# Patient Record
Sex: Male | Born: 1985 | Hispanic: Yes | Marital: Single | Smoking: Never smoker
Health system: Southern US, Community
[De-identification: ages and names within clinical notes are randomized; demographics above are authoritative.]

## PROBLEM LIST (undated history)

## (undated) DIAGNOSIS — S43006A Unspecified dislocation of unspecified shoulder joint, initial encounter: Secondary | ICD-10-CM

---

## 2012-02-12 ENCOUNTER — Emergency Department: Payer: Self-pay | Admitting: Emergency Medicine

## 2012-02-12 LAB — URINALYSIS, COMPLETE
Glucose,UR: NEGATIVE mg/dL (ref 0–75)
Leukocyte Esterase: NEGATIVE
Protein: 30
RBC,UR: 29 /HPF (ref 0–5)
WBC UR: <1 /HPF (ref 0–5)

## 2012-02-12 LAB — COMPREHENSIVE METABOLIC PANEL
Anion Gap: 8 (ref 7–16)
BUN: 19 mg/dL — ABNORMAL HIGH (ref 7–18)
Bilirubin,Total: 1.5 mg/dL — ABNORMAL HIGH (ref 0.2–1.0)
Chloride: 104 mmol/L (ref 98–107)
Co2: 27 mmol/L (ref 21–32)
Creatinine: 0.86 mg/dL (ref 0.60–1.30)
Glucose: 110 mg/dL — ABNORMAL HIGH (ref 65–99)
Potassium: 3.7 mmol/L (ref 3.5–5.1)
SGOT(AST): 12 U/L — ABNORMAL LOW (ref 15–37)
Sodium: 139 mmol/L (ref 136–145)

## 2012-02-12 LAB — CBC
HCT: 45.9 % (ref 40.0–52.0)
MCHC: 34.2 g/dL (ref 32.0–36.0)
MCV: 85 fL (ref 80–100)
Platelet: 205 10*3/uL (ref 150–440)
RBC: 5.4 10*6/uL (ref 4.40–5.90)
WBC: 7.9 10*3/uL (ref 3.8–10.6)

## 2020-03-23 ENCOUNTER — Other Ambulatory Visit: Payer: Self-pay | Admitting: Orthopedic Surgery

## 2020-03-23 DIAGNOSIS — S42142A Displaced fracture of glenoid cavity of scapula, left shoulder, initial encounter for closed fracture: Secondary | ICD-10-CM

## 2020-03-26 ENCOUNTER — Ambulatory Visit: Payer: Self-pay

## 2020-03-27 ENCOUNTER — Ambulatory Visit
Admission: RE | Admit: 2020-03-27 | Discharge: 2020-03-27 | Disposition: A | Discharge status: Home / Self Care | Payer: BLUE CROSS/BLUE SHIELD | Source: Ambulatory Visit | Attending: Orthopedic Surgery | Admitting: Orthopedic Surgery

## 2020-03-27 ENCOUNTER — Other Ambulatory Visit: Payer: Self-pay | Admitting: Orthopedic Surgery

## 2020-03-27 ENCOUNTER — Other Ambulatory Visit: Payer: Self-pay

## 2020-03-27 DIAGNOSIS — S42142A Displaced fracture of glenoid cavity of scapula, left shoulder, initial encounter for closed fracture: Secondary | ICD-10-CM | POA: Diagnosis present

## 2020-03-27 DIAGNOSIS — S42152A Displaced fracture of neck of scapula, left shoulder, initial encounter for closed fracture: Secondary | ICD-10-CM | POA: Insufficient documentation

## 2020-04-10 ENCOUNTER — Ambulatory Visit: Payer: Self-pay

## 2020-09-29 ENCOUNTER — Other Ambulatory Visit: Payer: Self-pay

## 2020-09-29 ENCOUNTER — Emergency Department: Payer: BLUE CROSS/BLUE SHIELD

## 2020-09-29 DIAGNOSIS — S4992XA Unspecified injury of left shoulder and upper arm, initial encounter: Secondary | ICD-10-CM | POA: Diagnosis present

## 2020-09-29 DIAGNOSIS — S43005A Unspecified dislocation of left shoulder joint, initial encounter: Secondary | ICD-10-CM | POA: Diagnosis not present

## 2020-09-29 DIAGNOSIS — Y990 Civilian activity done for income or pay: Secondary | ICD-10-CM | POA: Diagnosis not present

## 2020-09-29 DIAGNOSIS — W010XXA Fall on same level from slipping, tripping and stumbling without subsequent striking against object, initial encounter: Secondary | ICD-10-CM | POA: Diagnosis not present

## 2020-09-29 DIAGNOSIS — R52 Pain, unspecified: Secondary | ICD-10-CM

## 2020-09-29 NOTE — ED Triage Notes (Signed)
Patient with deformity to left shoulder after fall. Patient with previous hx of dislocation to same shoulder.

## 2020-09-30 ENCOUNTER — Encounter: Payer: Self-pay | Admitting: Radiology

## 2020-09-30 ENCOUNTER — Emergency Department
Admission: EM | Admit: 2020-09-30 | Discharge: 2020-09-30 | Disposition: A | Discharge status: Home / Self Care | Payer: BLUE CROSS/BLUE SHIELD | Attending: Emergency Medicine | Admitting: Emergency Medicine

## 2020-09-30 ENCOUNTER — Emergency Department: Payer: BLUE CROSS/BLUE SHIELD

## 2020-09-30 DIAGNOSIS — R52 Pain, unspecified: Secondary | ICD-10-CM

## 2020-09-30 DIAGNOSIS — S43005A Unspecified dislocation of left shoulder joint, initial encounter: Secondary | ICD-10-CM

## 2020-09-30 HISTORY — DX: Unspecified dislocation of unspecified shoulder joint, initial encounter: S43.006A

## 2020-09-30 MED ORDER — LIDOCAINE HCL (PF) 1 % IJ SOLN
10.0000 mL | Freq: Once | INTRAMUSCULAR | Status: AC
  Administered 2020-09-30: 10 mL via INTRADERMAL
  Filled 2020-09-30: qty 10

## 2020-09-30 MED ORDER — HYDROMORPHONE HCL 1 MG/ML IJ SOLN
1.0000 mg | Freq: Once | INTRAMUSCULAR | Status: AC
  Administered 2020-09-30: 1 mg via INTRAVENOUS
  Filled 2020-09-30: qty 1

## 2020-09-30 MED ORDER — HYDROMORPHONE HCL 1 MG/ML IJ SOLN
1.0000 mg | INTRAMUSCULAR | Status: AC
  Administered 2020-09-30: 1 mg via INTRAVENOUS

## 2020-09-30 MED ORDER — LIDOCAINE HCL (PF) 1 % IJ SOLN
30.0000 mL | Freq: Once | INTRAMUSCULAR | Status: DC
Start: 2020-09-30 — End: 2020-09-30
  Administered 2020-09-30: 30 mL
  Filled 2020-09-30: qty 30

## 2020-09-30 MED ORDER — HYDROMORPHONE HCL 1 MG/ML IJ SOLN
1.0000 mg | Freq: Once | INTRAMUSCULAR | Status: DC
  Filled 2020-09-30: qty 1

## 2020-09-30 NOTE — Discharge Instructions (Addendum)
Please call your orthopedic doctor that you have already been seen for your shoulder and let them know that it popped out again.  We have reduced it here in the emergency room.  Please keep your arm in the sling and follow-up with orthopedics.   IMPRESSION: Interval relocation of the left humeral head into the glenoid fossa. Normal alignment.   Displaced bony Bankart fracture fragment again noted.

## 2020-09-30 NOTE — ED Provider Notes (Signed)
[facility] Emergency Department Provider Note  ____________________________________________   Event Date/Time   First MD Initiated Contact with Patient 09/30/20 0031     (approximate)  I have reviewed the triage vital signs and the nursing notes.   HISTORY  Chief Complaint Shoulder Injury    HPI Jonathan Roth is a 35 y.o. male with prior shoulder dislocation who comes in with concern for recurrent dislocation.  Patient reports that he was at work when he slipped and kind of landed on his left shoulder.  Patient reports some pain that is severe, constant, worse with movement, better at rest.  States that it feels like his prior shoulder dislocation.  Denies hitting his head or neck.  Denies any other injuries.          Past Medical History:  Diagnosis Date   Shoulder dislocation     There are no problems to display for this patient.   History reviewed. No pertinent surgical history.  Prior to Admission medications   Not on File    Allergies Patient has no known allergies.  No family history on file.  Social History Social History   Tobacco Use   Smoking status: Never   Smokeless tobacco: Never  Substance Use Topics   Alcohol use: Yes   Drug use: Never      Review of Systems Constitutional: No fever/chills Eyes: No visual changes. ENT: No sore throat. Cardiovascular: Denies chest pain. Respiratory: Denies shortness of breath. Gastrointestinal: No abdominal pain.  No nausea, no vomiting.  No diarrhea.  No constipation. Genitourinary: Negative for dysuria. Musculoskeletal: Left shoulder pain Skin: Negative for rash. Neurological: Negative for headaches, focal weakness or numbness. All other ROS negative ____________________________________________   PHYSICAL EXAM:  VITAL SIGNS: ED Triage Vitals  Enc Vitals Group     BP 09/29/20 2302 124/85     Pulse Rate 09/29/20 2302 74     Resp 09/29/20 2302 20     Temp  09/29/20 2305 98.6 F (37 C)     Temp Source 09/29/20 2305 Oral     SpO2 09/29/20 2302 97 %     Weight 09/29/20 2307 205 lb (93 kg)     Height 09/29/20 2307 5\' 9"  (1.753 m)     Head Circumference --      Peak Flow --      Pain Score 09/29/20 2310 10     Pain Loc --      Pain Edu? --      Excl. in GC? --     Constitutional: Alert and oriented. Well appearing and in no acute distress. Eyes: Conjunctivae are normal. EOMI. Head: Atraumatic. Nose: No congestion/rhinnorhea. Mouth/Throat: Mucous membranes are moist.   Neck: No stridor. Trachea Midline. FROM Cardiovascular: Normal rate, regular rhythm. Grossly normal heart sounds.  Good peripheral circulation. Respiratory: Normal respiratory effort.  No retractions. Lungs CTAB. Gastrointestinal: Soft and nontender. No distention. No abdominal bruits.  Musculoskeletal: Deformity noted to the left shoulder with 2+ distal pulse able to flex and extend the wrist with sensation intact Neurologic:  Normal speech and language. No gross focal neurologic deficits are appreciated.  Skin:  Skin is warm, dry and intact. No rash noted. Psychiatric: Mood and affect are normal. Speech and behavior are normal. GU: Deferred   ____________________________________________   RADIOLOGY 11/29/20, personally viewed and evaluated these images (plain radiographs) as part of my medical decision making, as well as reviewing the written report by the radiologist.  ED MD interpretation: Initial x-ray concerning for anterior inferior dislocation and upon repeat reduction  Official radiology report(s): DG Shoulder Left  Result Date: 09/30/2020 CLINICAL DATA:  Left shoulder dislocation, post reduction imaging EXAM: LEFT SHOULDER - 2+ VIEW COMPARISON:  09/29/2020 FINDINGS: Interval relocation of the left humeral head into the glenoid fossa. Normal alignment. Glenohumeral and acromioclavicular joint spaces are preserved. Bony Bankart fracture fragment again seen  anteroinferior to the glenoid rim. Visualized left hemithorax is unremarkable. IMPRESSION: Interval relocation of the left humeral head into the glenoid fossa. Normal alignment. Displaced bony Bankart fracture fragment again noted. Electronically Signed   By: Helyn Numbers M.D.   On: 09/30/2020 02:39   DG Shoulder Left  Result Date: 09/29/2020 CLINICAL DATA:  Left shoulder pain after fall. EXAM: LEFT SHOULDER - 2+ VIEW COMPARISON:  Shoulder CT 03/27/2020 FINDINGS: Anterior inferior dislocation of the humerus with respect to the glenoid. The previously seen bony Bankart lesion is tentatively visualized adjacent to the anterior inferior rim of the glenoid on the transscapular Y-view. Possible Hill-Sachs impaction injury to the lateral humeral head. The acromioclavicular joint is congruent. IMPRESSION: Anterior inferior dislocation of the humerus with respect to the glenoid. The previously seen bony Bankart lesion is tentatively visualized adjacent to the glenoid rim. Electronically Signed   By: Narda Rutherford M.D.   On: 09/29/2020 23:39    ____________________________________________   PROCEDURES  Procedure(s) performed (including Critical Care):  Reduction of dislocation  Date/Time: 09/30/2020 3:29 AM Performed by: Concha Se, MD Authorized by: Concha Se, MD  Consent: Verbal consent obtained. Consent given by: patient Patient understanding: patient states understanding of the procedure being performed Patient consent: the patient's understanding of the procedure matches consent given Procedure consent: procedure consent matches procedure scheduled Relevant documents: relevant documents present and verified Test results: test results available and properly labeled Imaging studies: imaging studies available Patient identity confirmed: verbally with patient Local anesthesia used: yes Anesthesia: local infiltration  Anesthesia: Local anesthesia used: yes Local Anesthetic: lidocaine  1% with epinephrine Anesthetic total: 5 mL  Sedation: Patient sedated: no  Patient tolerance: patient tolerated the procedure well with no immediate complications     ____________________________________________   INITIAL IMPRESSION / ASSESSMENT AND PLAN / ED COURSE  Jonathan Roth was evaluated in Emergency Department on 09/30/2020 for the symptoms described in the history of present illness. He was evaluated in the context of the global COVID-19 pandemic, which necessitated consideration that the patient might be at risk for infection with the SARS-CoV-2 virus that causes COVID-19. Institutional protocols and algorithms that pertain to the evaluation of patients at risk for COVID-19 are in a state of rapid change based on information released by regulatory bodies including the CDC and federal and state organizations. These policies and algorithms were followed during the patient's care in the ED.    Patient comes in with concern for left shoulder dislocation.  This is now the second time that this is happened.  Previously was able to be reduced without sedation.  Patient was given some IV Dilaudid and intra-articular lidocaine block was done after consenting family about the risk for infections, bleeding, pain.  Reduction was achieved and patient was placed into a sling and will follow up with his orthopedic doctor that he already has for his shoulder.  Post reduction patient is remains neurovascularly intact with good distal pulse.  X-ray confirms relocation with a Bankart fracture as seen on the initial x-ray       ____________________________________________  FINAL CLINICAL IMPRESSION(S) / ED DIAGNOSES   Final diagnoses:  Pain  Dislocation of left shoulder joint, initial encounter      MEDICATIONS GIVEN DURING THIS VISIT:  Medications  lidocaine (PF) (XYLOCAINE) 1 % injection 30 mL (has no administration in time range)  lidocaine (PF) (XYLOCAINE) 1 % injection 10 mL  (has no administration in time range)  HYDROmorphone (DILAUDID) injection 1 mg (1 mg Intravenous Given 09/30/20 0015)  HYDROmorphone (DILAUDID) injection 1 mg (1 mg Intravenous Given 09/30/20 0120)     ED Discharge Orders     None        Note:  This document was prepared using Dragon voice recognition software and may include unintentional dictation errors.    Concha Se, MD 09/30/20 [phone]

## 2022-06-26 IMAGING — DX DG SHOULDER 2+V*L*
4 series · 4 of 4 positions shown · non-contrast
Comparison: 09/29/2020

CLINICAL DATA: Left shoulder dislocation, post reduction imaging

EXAM:
LEFT SHOULDER - 2+ VIEW

[shoulder ap (1 of 2)]
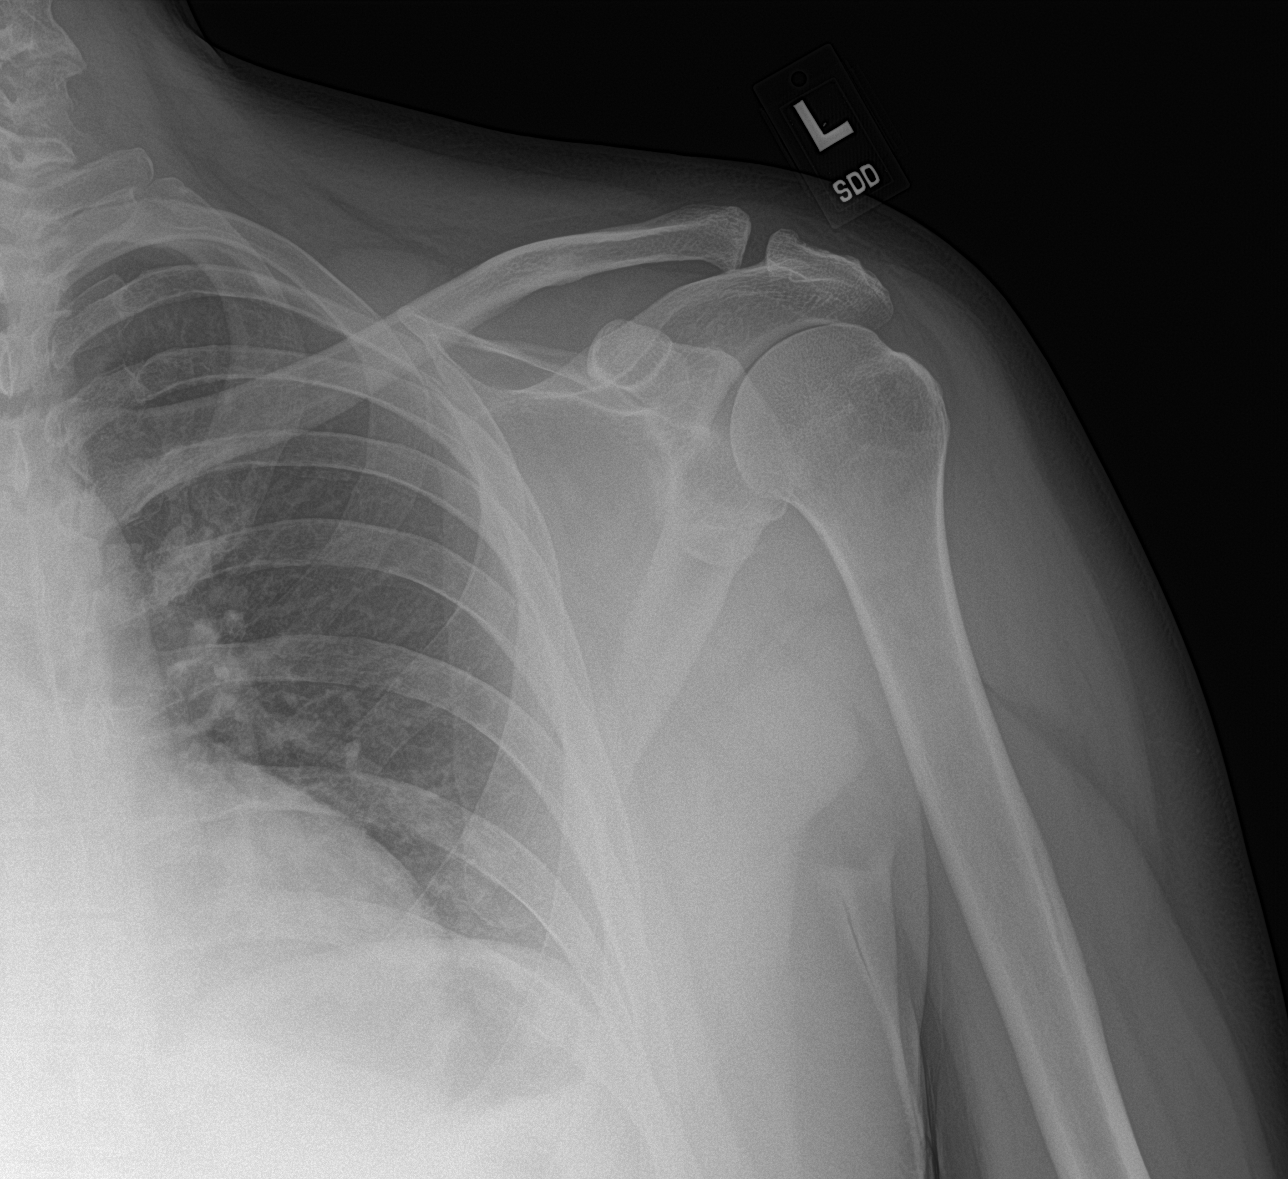

[shoulder ap (2 of 2)]
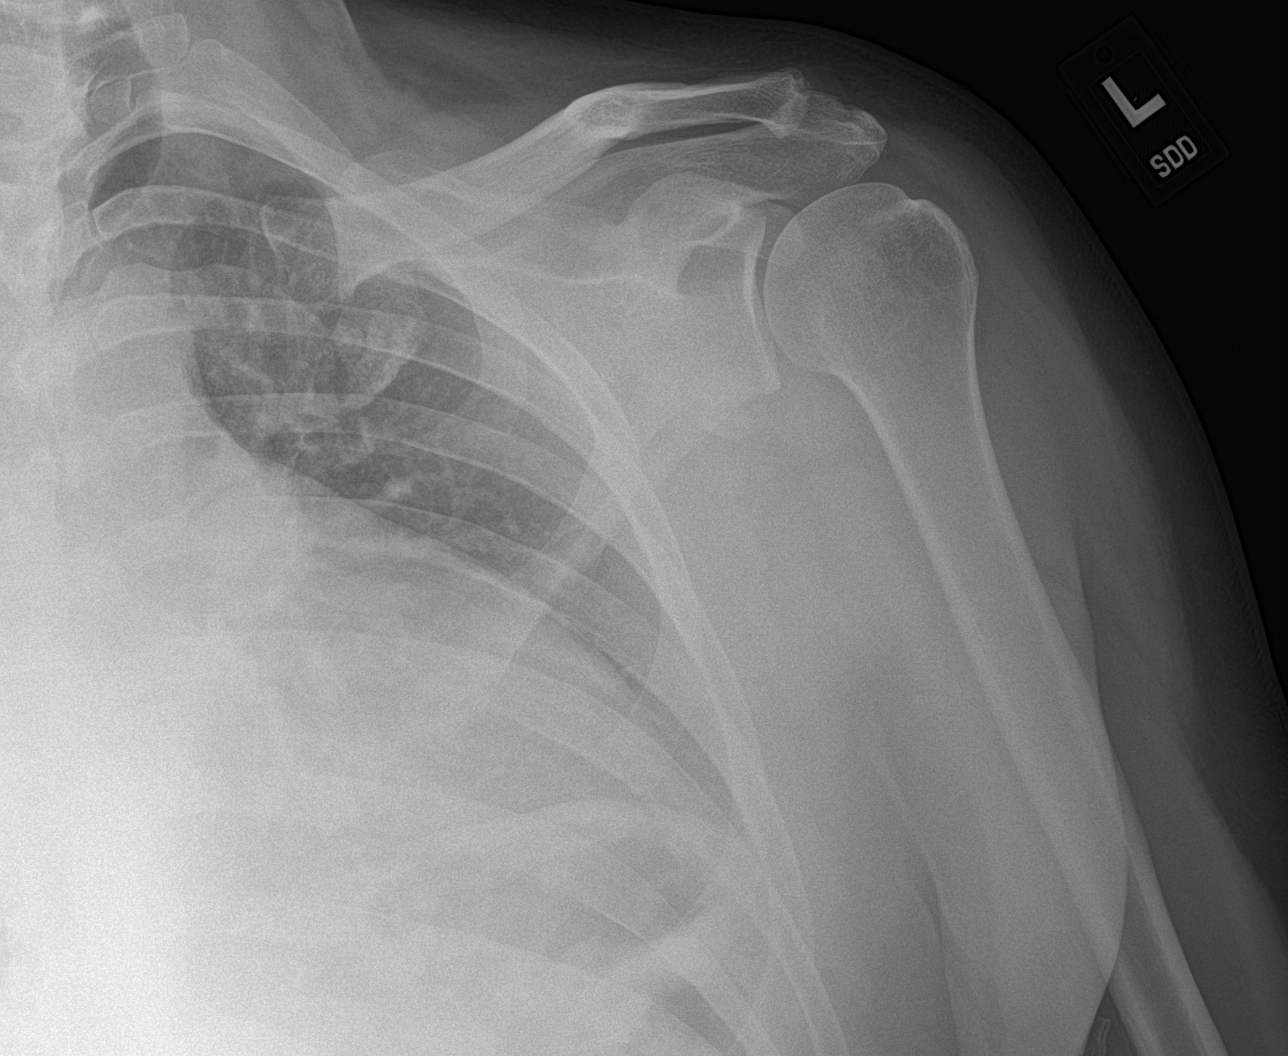

[shoulder swimmer (1 of 2)]
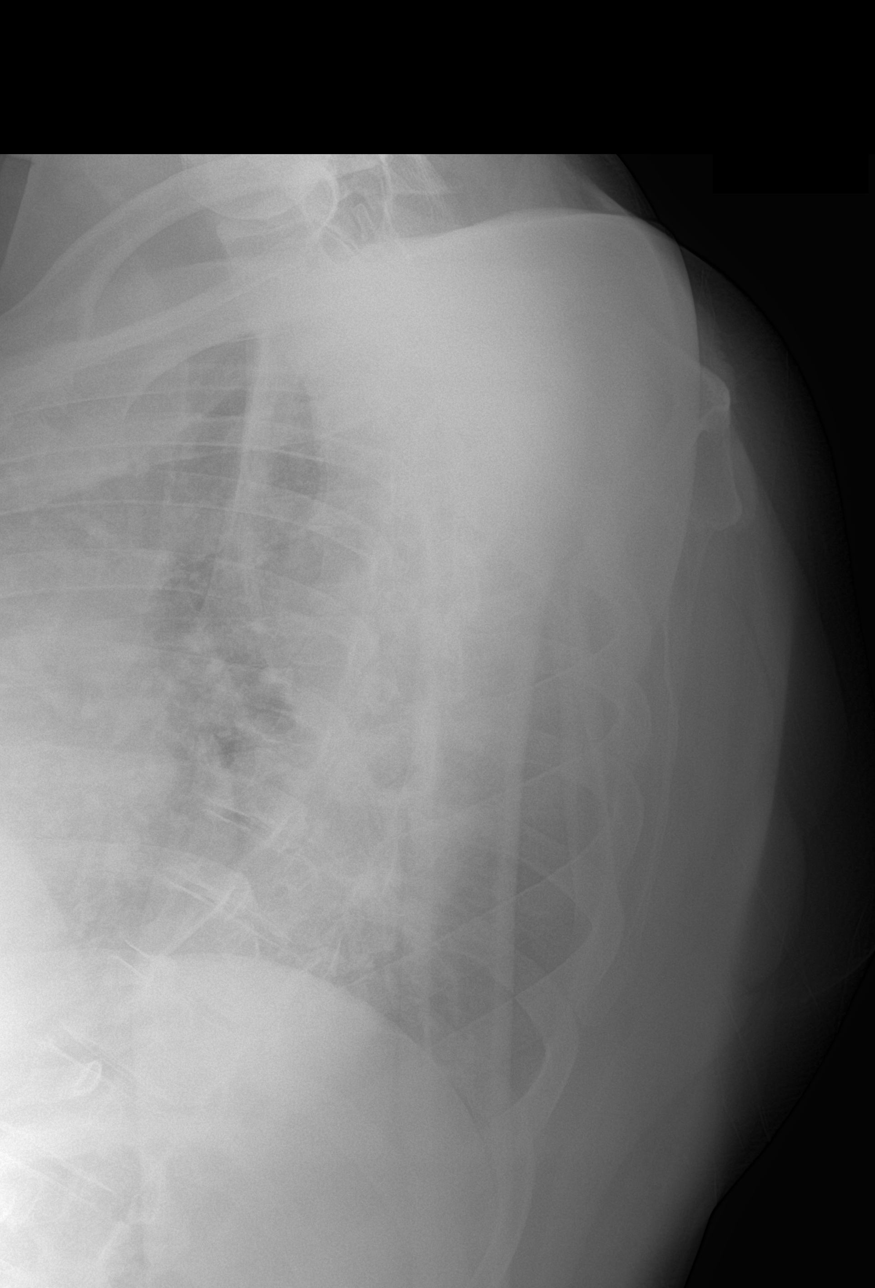

[shoulder swimmer (2 of 2)]
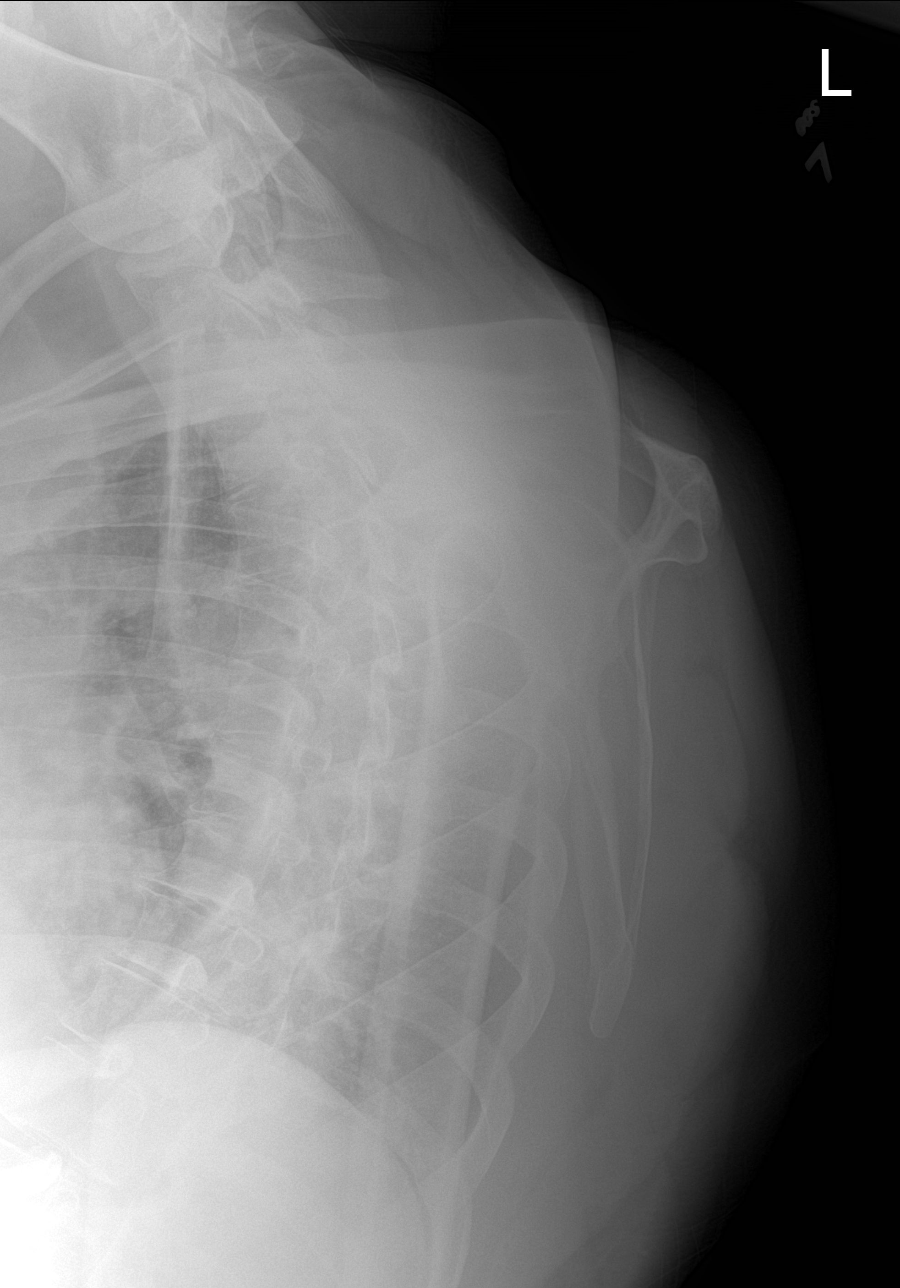

[4 of 4 positions shown; findings below may reference images not displayed]

FINDINGS: Interval relocation of the left humeral head into the glenoid fossa.
Normal alignment. Glenohumeral and acromioclavicular joint spaces
are preserved. Bony Bankart fracture fragment again seen
anteroinferior to the glenoid rim. Visualized left hemithorax is
unremarkable.
IMPRESSION: Interval relocation of the left humeral head into the glenoid fossa.
Normal alignment.

Displaced bony Bankart fracture fragment again noted.

## 2022-07-17 ENCOUNTER — Emergency Department: Payer: BLUE CROSS/BLUE SHIELD

## 2022-07-17 ENCOUNTER — Emergency Department
Admission: EM | Admit: 2022-07-17 | Discharge: 2022-07-17 | Disposition: A | Discharge status: Home / Self Care | Payer: BLUE CROSS/BLUE SHIELD | Attending: Emergency Medicine | Admitting: Emergency Medicine

## 2022-07-17 ENCOUNTER — Other Ambulatory Visit: Payer: Self-pay

## 2022-07-17 DIAGNOSIS — X509XXA Other and unspecified overexertion or strenuous movements or postures, initial encounter: Secondary | ICD-10-CM | POA: Diagnosis not present

## 2022-07-17 DIAGNOSIS — S43015A Anterior dislocation of left humerus, initial encounter: Secondary | ICD-10-CM | POA: Diagnosis not present

## 2022-07-17 DIAGNOSIS — S43005A Unspecified dislocation of left shoulder joint, initial encounter: Secondary | ICD-10-CM

## 2022-07-17 DIAGNOSIS — S4992XA Unspecified injury of left shoulder and upper arm, initial encounter: Secondary | ICD-10-CM | POA: Diagnosis present

## 2022-07-17 MED ORDER — HYDROMORPHONE HCL 1 MG/ML IJ SOLN
1.0000 mg | Freq: Once | INTRAMUSCULAR | Status: AC
  Administered 2022-07-17: 1 mg via INTRAVENOUS
  Filled 2022-07-17: qty 1

## 2022-07-17 MED ORDER — LIDOCAINE HCL (PF) 1 % IJ SOLN
20.0000 mL | Freq: Once | INTRAMUSCULAR | Status: AC
  Administered 2022-07-17: 20 mL
  Filled 2022-07-17: qty 20

## 2022-07-17 NOTE — ED Triage Notes (Signed)
Pt here with a left shoulder dislocation. Pt has hx of the same. Pt was trying to take off his shirt and dislocated it. Pt ambulatory to triage.

## 2022-07-17 NOTE — ED Provider Notes (Signed)
[facility] Provider Note    Event Date/Time   First MD Initiated Contact with Patient 07/17/22 1116     (approximate)   History   Chief Complaint Dislocation   HPI  Jonathan Roth is a 37 y.o. male with no significant past medical history who presents to the ED complaining of shoulder dislocation.  Patient reports that he has dislocated his shoulder multiple times in the past couple of years, was taking his shirt off today when he felt it pop out of place.  He states he is typically able to pop it back in on his own, but was unable to do so today.  He reports significant pain in his left shoulder with inability to lift his arm up.  He denies any pain in his elbow or wrist.     Physical Exam   Triage Vital Signs: ED Triage Vitals [07/17/22 1115]  Enc Vitals Group     BP 122/80     Pulse Rate 89     Resp 20     Temp 97.9 F (36.6 C)     Temp Source Oral     SpO2 100 %     Weight 205 lb 0.4 oz (93 kg)     Height 5\' 9"  (1.753 m)     Head Circumference      Peak Flow      Pain Score 10     Pain Loc      Pain Edu?      Excl. in GC?     Most recent vital signs: Vitals:   07/17/22 1115  BP: 122/80  Pulse: 89  Resp: 20  Temp: 97.9 F (36.6 C)  SpO2: 100%    Constitutional: Alert and oriented. Eyes: Conjunctivae are normal. Head: Atraumatic. Nose: No congestion/rhinnorhea. Mouth/Throat: Mucous membranes are moist.  Cardiovascular: Normal rate, regular rhythm. Grossly normal heart sounds.  2+ radial pulses bilaterally. Respiratory: Normal respiratory effort.  No retractions. Lungs CTAB. Gastrointestinal: Soft and nontender. No distention. Musculoskeletal: Obvious deformity to left shoulder with limited range of motion.  No tenderness to palpation or deformity at left elbow or wrist.  Range of motion intact through left elbow and wrist.  No lower extremity tenderness nor edema.  Neurologic:  Normal speech and language. No gross focal  neurologic deficits are appreciated.    ED Results / Procedures / Treatments   Labs (all labs ordered are listed, but only abnormal results are displayed) Labs Reviewed - No data to display  RADIOLOGY Left shoulder x-ray reviewed and interpreted by me with the anterior dislocation, no fracture noted.  PROCEDURES:  Critical Care performed: No  .Ortho Injury Treatment  Date/Time: 07/17/2022 1:37 PM  Performed by: Chesley Noon, MD Authorized by: Chesley Noon, MD   Consent:    Consent obtained:  Verbal   Consent given by:  Patient   Risks discussed:  Fracture, irreducible dislocation, restricted joint movement, stiffness, vascular damage, nerve damage and recurrent dislocationInjury location: shoulder Location details: left shoulder Injury type: dislocation Dislocation type: anterior Hill-Sachs deformity: no Chronicity: recurrent Pre-procedure neurovascular assessment: neurovascularly intact Pre-procedure distal perfusion: normal Pre-procedure neurological function: normal Pre-procedure range of motion: reduced Anesthesia: local infiltration  Anesthesia: Local anesthesia used: yes Local Anesthetic: lidocaine 1% without epinephrine Anesthetic total: 15 mL  Patient sedated: NoManipulation performed: yes Reduction method: traction and counter traction and scapular manipulation Reduction successful: yes X-ray confirmed reduction: yes Immobilization: sling Post-procedure neurovascular assessment: post-procedure neurovascularly intact Post-procedure distal perfusion: normal Post-procedure  neurological function: normal Post-procedure range of motion: improved      MEDICATIONS ORDERED IN ED: Medications  HYDROmorphone (DILAUDID) injection 1 mg (1 mg Intravenous Given 07/17/22 1241)  lidocaine (PF) (XYLOCAINE) 1 % injection 20 mL (20 mLs Other Given 07/17/22 1256)     IMPRESSION / MDM / ASSESSMENT AND PLAN / ED COURSE  I reviewed the triage vital signs and the  nursing notes.                              37 y.o. male with no significant past medical history who presents to the ED complaining of shoulder dislocation just prior to arrival while attempting to take his shirt off.  Patient's presentation is most consistent with acute complicated illness / injury requiring diagnostic workup.  Differential diagnosis includes, but is not limited to, shoulder dislocation, fracture, strain.  Patient nontoxic-appearing and in no acute distress, vital signs are unremarkable.  He is neurovascular intact to his distal left upper extremity, does have obvious deformity and x-ray confirms anterior dislocation.  No associated fracture noted.  Patient medicated with IV Dilaudid and intra-articular lidocaine was administered.  Shoulder was successfully reduced with traction and countertraction, place and placed in sling/immobilizer.  He is appropriate for discharge home with orthopedic follow-up, was counseled to return to the ED for new or worsening symptoms.  Patient agrees with plan.      FINAL CLINICAL IMPRESSION(S) / ED DIAGNOSES   Final diagnoses:  Dislocation of left shoulder joint, initial encounter     Rx / DC Orders   ED Discharge Orders     None        Note:  This document was prepared using Dragon voice recognition software and may include unintentional dictation errors.   Chesley Noon, MD 07/17/22 1341

## 2023-02-03 ENCOUNTER — Emergency Department: Payer: Self-pay

## 2023-02-03 ENCOUNTER — Emergency Department
Admission: EM | Admit: 2023-02-03 | Discharge: 2023-02-03 | Disposition: A | Discharge status: Home / Self Care | Payer: Self-pay | Attending: Emergency Medicine | Admitting: Emergency Medicine

## 2023-02-03 ENCOUNTER — Other Ambulatory Visit: Payer: Self-pay

## 2023-02-03 DIAGNOSIS — I8002 Phlebitis and thrombophlebitis of superficial vessels of left lower extremity: Secondary | ICD-10-CM | POA: Insufficient documentation

## 2023-02-03 NOTE — ED Provider Triage Note (Signed)
 Emergency Medicine Provider Triage Evaluation Note  Jonathan Roth , a 38 y.o. male  was evaluated in triage.  Pt complains of left calf pain x 2 days. No known injury. Pain is worse with palpation. He had similar pain in the ankle a few days ago, but that has since gone away. No history of DVT/PE. Denies chest pain or shortness of breath..  Physical Exam  BP (!) 132/96   Pulse 72   Temp 98.1 F (36.7 C)   Resp 18   Ht 5' 9 (1.753 m)   Wt 98 kg   SpO2 100%   BMI 31.90 kg/m  Gen:   Awake, no distress   Resp:  Normal effort  MSK:   Moves extremities without difficulty  Other:    Medical Decision Making  Medically screening exam initiated at 12:46 PM.  Appropriate orders placed.  Jonathan Roth was informed that the remainder of the evaluation will be completed by another provider, this initial triage assessment does not replace that evaluation, and the importance of remaining in the ED until their evaluation is complete.  Venous US  ordered.   Jonathan Kirk NOVAK, FNP 02/03/23 1248

## 2023-02-03 NOTE — ED Provider Notes (Signed)
 [facility] Provider Note    Event Date/Time   First MD Initiated Contact with Patient 02/03/23 1316     (approximate)   History   Leg Pain   HPI  Jonathan Roth is a 38 y.o. male who presents today for evaluation of left calf pain for the past 2 days.  Patient went to cardiology clinic and they sent him to the emergency department for further evaluation.  Patient is worried about having a blood clot.  He is never had a blood clot before.  No recent trips or travel.  He has not noticed swelling to his leg.  No chest pain or shortness of breath.  There are no active problems to display for this patient.         Physical Exam   Triage Vital Signs: ED Triage Vitals [02/03/23 1245]  Encounter Vitals Group     BP (!) 132/96     Systolic BP Percentile      Diastolic BP Percentile      Pulse Rate 72     Resp 18     Temp 98.1 F (36.7 C)     Temp src      SpO2 100 %     Weight 216 lb (98 kg)     Height 5' 9 (1.753 m)     Head Circumference      Peak Flow      Pain Score 0     Pain Loc      Pain Education      Exclude from Growth Chart     Most recent vital signs: Vitals:   02/03/23 1245  BP: (!) 132/96  Pulse: 72  Resp: 18  Temp: 98.1 F (36.7 C)  SpO2: 100%    Physical Exam Vitals and nursing note reviewed.  Constitutional:      General: Awake and alert. No acute distress.    Appearance: Normal appearance. The patient is normal weight.  HENT:     Head: Normocephalic and atraumatic.     Mouth: Mucous membranes are moist.  Eyes:     General: PERRL. Normal EOMs        Right eye: No discharge.        Left eye: No discharge.     Conjunctiva/sclera: Conjunctivae normal.  Cardiovascular:     Rate and Rhythm: Normal rate and regular rhythm.     Pulses: Normal pulses.  Pulmonary:     Effort: Pulmonary effort is normal. No respiratory distress.     Breath sounds: Normal breath sounds.  Abdominal:     Abdomen is soft. There is  no abdominal tenderness. No rebound or guarding. No distention. Musculoskeletal:        General: No swelling. Normal range of motion.     Cervical back: Normal range of motion and neck supple Left lower extremity: Palpable small vessels to the calf without overlying erythema.  No pitting edema.  Normal pedal pulses.  Normal strength and sensation throughout foot, ankle, and lower extremity.  Negative Thompson test. Skin:    General: Skin is warm and dry.     Capillary Refill: Capillary refill takes less than 2 seconds.     Findings: No rash.  Neurological:     Mental Status: The patient is awake and alert.      ED Results / Procedures / Treatments   Labs (all labs ordered are listed, but only abnormal results are displayed) Labs Reviewed - No data to  display   EKG     RADIOLOGY I independently reviewed and interpreted imaging and agree with radiologists findings.     PROCEDURES:  Critical Care performed:   Procedures   MEDICATIONS ORDERED IN ED: Medications - No data to display   IMPRESSION / MDM / ASSESSMENT AND PLAN / ED COURSE  I reviewed the triage vital signs and the nursing notes.   Differential diagnosis includes, but is not limited to, DVT, thrombophlebitis, calf strain, Baker's cyst.  Patient is awake and alert, hemodynamically stable and afebrile.  He is nontoxic in appearance.  Lower extremity ultrasound obtained in triage is negative for DVT, though does reveal superficial thrombophlebitis.  There is no overlying erythema or evidence of cellulitis.  No history of previous, exam and history not consistent with migratory thrombophlebitis.  I discussed this with the patient.  We discussed rest, ice, elevation, and NSAIDs for management.  We discussed return precautions and the importance of close outpatient follow-up.  Patient understands and agrees with plan.  He was discharged in stable condition.   Patient's presentation is most consistent with acute  complicated illness / injury requiring diagnostic workup.   FINAL CLINICAL IMPRESSION(S) / ED DIAGNOSES   Final diagnoses:  Thrombophlebitis of superficial veins of left lower extremity     Rx / DC Orders   ED Discharge Orders     None        Note:  This document was prepared using Dragon voice recognition software and may include unintentional dictation errors.   Tannie Koskela E, PA-C 02/03/23 1739    Arlander Charleston, MD 02/07/23 [phone]

## 2023-02-03 NOTE — Discharge Instructions (Addendum)
 There is no blood clot on your ultrasound.  You have some superficial thrombophlebitis.  You may treat this with ibuprofen 600 mg every 6-8 hours.  Please return for any new, worsening, or change in symptoms or other concerns.  It was a pleasure caring for you today.

## 2023-02-03 NOTE — ED Triage Notes (Signed)
 First Nurse Note: Patient to ED from [facility] for knot of left calf pain x2 days. Sent for r/o of DVT.

## 2023-02-03 NOTE — ED Triage Notes (Signed)
 Pt to ED for left calf pain x2 days.

## 2023-03-10 ENCOUNTER — Other Ambulatory Visit: Payer: Self-pay

## 2023-03-10 ENCOUNTER — Emergency Department: Payer: BC Managed Care – PPO

## 2023-03-10 ENCOUNTER — Emergency Department
Admission: EM | Admit: 2023-03-10 | Discharge: 2023-03-10 | Disposition: A | Discharge status: Home / Self Care | Payer: BC Managed Care – PPO | Attending: Emergency Medicine | Admitting: Emergency Medicine

## 2023-03-10 DIAGNOSIS — I8002 Phlebitis and thrombophlebitis of superficial vessels of left lower extremity: Secondary | ICD-10-CM | POA: Diagnosis not present

## 2023-03-10 DIAGNOSIS — M79662 Pain in left lower leg: Secondary | ICD-10-CM

## 2023-03-10 DIAGNOSIS — M7989 Other specified soft tissue disorders: Secondary | ICD-10-CM | POA: Diagnosis present

## 2023-03-10 LAB — BASIC METABOLIC PANEL
Anion gap: 8 (ref 5–15)
BUN: 18 mg/dL (ref 6–20)
CO2: 26 mmol/L (ref 22–32)
Calcium: 8.8 mg/dL — ABNORMAL LOW (ref 8.9–10.3)
Chloride: 102 mmol/L (ref 98–111)
Creatinine, Ser: 0.69 mg/dL (ref 0.61–1.24)
GFR, Estimated: >60 mL/min (ref >60)
Glucose, Bld: 95 mg/dL (ref 70–99)
Potassium: 4.1 mmol/L (ref 3.5–5.1)
Sodium: 136 mmol/L (ref 135–145)

## 2023-03-10 LAB — CBC
HCT: 44.6 % (ref 39.0–52.0)
Hemoglobin: 15.3 g/dL (ref 13.0–17.0)
MCH: 29.4 pg (ref 26.0–34.0)
MCHC: 34.3 g/dL (ref 30.0–36.0)
MCV: 85.8 fL (ref 80.0–100.0)
Platelets: 260 10*3/uL (ref 150–400)
RBC: 5.2 MIL/uL (ref 4.22–5.81)
RDW: 12 % (ref 11.5–15.5)
WBC: 7.2 10*3/uL (ref 4.0–10.5)
nRBC: 0 % (ref 0.0–0.2)

## 2023-03-10 MED ORDER — APIXABAN (ELIQUIS) VTE STARTER PACK (10MG AND 5MG): ORAL_TABLET | ORAL | 0 refills | Status: AC

## 2023-03-10 NOTE — Discharge Instructions (Addendum)
You were evaluated in the ED for pain and swelling of your left foot.  Your lab work was reassuring.  We obtained an ultrasound which showed a persistent superficial thrombophlebitis.  We have placed you on Eliquis.  Please take as instructed.  Call and schedule an appointment with vascular for a repeat DVT study in 2 weeks.  In the interim, keep your foot elevated. Wear compression socks and use ACE wrap for compression.  Use crutches as assistance.  Get plenty of rest.

## 2023-03-10 NOTE — ED Triage Notes (Addendum)
Pt here with left foot pain x1 week. Pt states it started on its own and the pain and swelling is severe. Pt denies falls or injuries. Pt had a clot is his superficial vein in the left leg last month. Pt states pain is worse on the top of his foot. Pt denies cp or sob. Pt limping to triage.

## 2023-03-10 NOTE — ED Notes (Signed)
Pt verbalizes understanding of discharge instructions. Opportunity for questioning and answers were provided. Pt discharged from ED to home.   ? ?

## 2023-03-10 NOTE — ED Notes (Signed)
Pt states possible blood clot in ankle. Pt states hx of this and worried it might be one. Pt states discoloration to ankle and swelling.

## 2023-03-10 NOTE — ED Provider Notes (Signed)
[facility] Emergency Department Provider Note     Event Date/Time   First MD Initiated Contact with Patient 03/10/23 1133     (approximate)   History   Foot Pain   HPI  Jonathan Roth is a 38 y.o. male presents to the ED for evaluation of left foot pain and increased swelling x 1 week.  Patient was seen in this ED on 01/07 with diagnosis with superficial thrombophlebitis. Patient reports failed outpatient treatment with NSAIDs.  Patient reports swelling has progressed and is now having difficulty ambulation due to pain.  Denies recent travel or surgeries.  Denies chest pain and shortness of breath.     Physical Exam   Triage Vital Signs: ED Triage Vitals  Encounter Vitals Group     BP 03/10/23 1048 116/73     Systolic BP Percentile --      Diastolic BP Percentile --      Pulse Rate 03/10/23 1048 67     Resp 03/10/23 1048 17     Temp 03/10/23 1046 98.2 F (36.8 C)     Temp src --      SpO2 03/10/23 1048 97 %     Weight 03/10/23 1048 216 lb 0.8 oz (98 kg)     Height 03/10/23 1048 5\' 9"  (1.753 m)     Head Circumference --      Peak Flow --      Pain Score 03/10/23 1048 10     Pain Loc --      Pain Education --      Exclude from Growth Chart --     Most recent vital signs: Vitals:   03/10/23 1048 03/10/23 1549  BP: 116/73 115/72  Pulse: 67 66  Resp: 17 17  Temp:    SpO2: 97% 97%    General Awake, no distress.  HEENT NCAT. PERRL. EOMI. No rhinorrhea. Mucous membranes are moist. CV:  Good peripheral perfusion.  RESP:  Normal effort. ABD:  No distention.  Other:  Left lower extremity reveals noticeable edema over midfoot on the dorsal aspect of foot.  Normal pedal pulses.  No pitting edema.  Tenderness with palpation over dorsal midfoot.  5/5 lower extremity strength.  Neurovascular status intact all throughout.  Capillary refills brisk and normal.   ED Results / Procedures / Treatments   Labs (all labs ordered are listed, but  only abnormal results are displayed) Labs Reviewed  BASIC METABOLIC PANEL - Abnormal; Notable for the following components:      Result Value   Calcium 8.8 (*)    All other components within normal limits  CBC    RADIOLOGY I personally viewed and evaluated these images as part of my medical decision making, as well as reviewing the written report by the radiologist.  ED Provider Interpretation: No obvious DVT noted.  Will confirm with final radiology read.  US Venous Img Lower Unilateral Left (DVT) Result Date: 03/10/2023 CLINICAL DATA:  Follow-up of superficial thrombophlebitis of the left ankle, distal calf and foot with continued pain and edema. EXAM: LEFT LOWER EXTREMITY VENOUS DOPPLER ULTRASOUND TECHNIQUE: Gray-scale sonography with graded compression, as well as color Doppler and duplex ultrasound were performed to evaluate the lower extremity deep venous systems from the level of the common femoral vein and including the common femoral, femoral, profunda femoral, popliteal and calf veins including the posterior tibial, peroneal and gastrocnemius veins when visible. The superficial great saphenous vein was also interrogated. Spectral Doppler was utilized  to evaluate flow at rest and with distal augmentation maneuvers in the common femoral, femoral and popliteal veins. COMPARISON:  02/03/2023 FINDINGS: Contralateral Common Femoral Vein: Respiratory phasicity is normal and symmetric with the symptomatic side. No evidence of thrombus. Normal compressibility. Common Femoral Vein: No evidence of thrombus. Normal compressibility, respiratory phasicity and response to augmentation. Saphenofemoral Junction: No evidence of thrombus. Normal compressibility and flow on color Doppler imaging. Profunda Femoral Vein: No evidence of thrombus. Normal compressibility and flow on color Doppler imaging. Femoral Vein: No evidence of thrombus. Normal compressibility, respiratory phasicity and response to  augmentation. Popliteal Vein: No evidence of thrombus. Normal compressibility, respiratory phasicity and response to augmentation. Calf Veins: No evidence of thrombus. Normal compressibility and flow on color Doppler imaging. Superficial Great Saphenous Vein: No evidence of thrombus. Normal compressibility. Venous Reflux:  None. Other Findings: Persistent superficial thrombophlebitis of a superficial vein/varicosity along the medial calf extending to the ankle and medial aspect of the foot. Based on visualized distribution by ultrasound today the thrombus is more echogenic consistent with chronicity and may be slightly more extensive. No propagation is identified into the deep venous system. IMPRESSION: 1. No evidence of left lower extremity deep venous thrombosis. 2. Persistent superficial thrombophlebitis of the medial calf extending to the ankle and medial aspect of the foot. Based on visualized distribution by ultrasound today the thrombus is more echogenic consistent with chronicity and may be slightly more extensive. No propagation is identified into the deep venous system. Electronically Signed   By: Irish Lack M.D.   On: 03/10/2023 14:51    PROCEDURES:  Critical Care performed: No  Procedures   MEDICATIONS ORDERED IN ED: Medications - No data to display   IMPRESSION / MDM / ASSESSMENT AND PLAN / ED COURSE  I reviewed the triage vital signs and the nursing notes.                             Clinical Course as of 03/10/23 1628  Tue Mar 10, 2023  1627 Discussed care plan with Vascular - Sheppard Plumber, NP will see pt in 2 week O/P for repeat DVT study follow up  [MH]    Clinical Course User Index [MH] Kern Reap A, PA-C   38 y.o. male presents to the emergency department for evaluation and treatment of left foot swelling. See HPI for further details.   Differential diagnosis includes, but is not limited to thrombophlebitis, DVT, cellulitis  Patient's presentation is most  consistent with acute complicated illness / injury requiring diagnostic workup.  Patient is alert and oriented.  He is hemodynamically stable and afebrile.  He is well-appearing.  Physical exam findings are as stated above.  Ultrasound obtained in triage.  Negative for DVT, however does note persistent superficial thrombophlebitis of the medial calf extending to the ankle and medial aspect of the foot that is noted to be slightly more extensive.  Given that there is more extensive thrombophlebitis we will place patient on Eliquis.  He is given an Ace wrap and crutches for the affected extremity for compression.  We thoroughly discussed RICE method at home. I discussed care plan with vascular who agrees and will see the patient in 2 weeks for repeat DVT study.  Patient understands and agrees with plan.  He is in stable condition for discharge home at this time.  ED return precautions are discussed.  All questions concerns were addressed during this ED visit.  FINAL CLINICAL IMPRESSION(S) / ED DIAGNOSES   Final diagnoses:  Thrombophlebitis of superficial veins of left lower extremity     Rx / DC Orders   ED Discharge Orders          Ordered    APIXABAN (ELIQUIS) VTE STARTER PACK (10MG  AND 5MG )       Note to Pharmacy: If starter pack unavailable, substitute with seventy-four 5 mg apixaban tabs following the above SIG directions.   03/10/23 1613             Note:  This document was prepared using Dragon voice recognition software and may include unintentional dictation errors.    Romeo Apple, Jenayah Antu A, PA-C 03/10/23 1629    Concha Se, MD 03/11/23 [phone]

## 2023-03-12 ENCOUNTER — Encounter (INDEPENDENT_AMBULATORY_CARE_PROVIDER_SITE_OTHER): Payer: Self-pay | Admitting: Vascular Surgery

## 2023-03-12 ENCOUNTER — Ambulatory Visit (INDEPENDENT_AMBULATORY_CARE_PROVIDER_SITE_OTHER): Payer: BC Managed Care – PPO | Admitting: Vascular Surgery

## 2023-03-12 VITALS — BP 104/68 | HR 68 | Resp 16 | Wt 212.6 lb

## 2023-03-12 DIAGNOSIS — I8002 Phlebitis and thrombophlebitis of superficial vessels of left lower extremity: Secondary | ICD-10-CM

## 2023-03-12 DIAGNOSIS — I8312 Varicose veins of left lower extremity with inflammation: Secondary | ICD-10-CM

## 2023-03-12 DIAGNOSIS — I8311 Varicose veins of right lower extremity with inflammation: Secondary | ICD-10-CM | POA: Diagnosis not present

## 2023-03-14 ENCOUNTER — Encounter (INDEPENDENT_AMBULATORY_CARE_PROVIDER_SITE_OTHER): Payer: Self-pay | Admitting: Vascular Surgery

## 2023-03-14 DIAGNOSIS — I8311 Varicose veins of right lower extremity with inflammation: Secondary | ICD-10-CM | POA: Insufficient documentation

## 2023-03-14 DIAGNOSIS — I809 Phlebitis and thrombophlebitis of unspecified site: Secondary | ICD-10-CM | POA: Insufficient documentation

## 2023-03-14 MED ORDER — APIXABAN 5 MG PO TABS: 5.0000 mg | ORAL_TABLET | Freq: Two times a day (BID) | ORAL | 6 refills | Status: AC

## 2023-03-14 NOTE — Progress Notes (Signed)
 MRN : [id number]  Jonathan Roth is a 38 y.o. (09-Jun-1985) male who presents with chief complaint of varicose veins hurt.  History of Present Illness:   Patient presents as a referral from the emergency room.  He has been seen twice recently secondary to severe superficial thrombophlebitis of the left ankle and calf area.  Initially in the emergency room duplex ultrasound demonstrated the phlebitis and he was given conservative therapies including anti-inflammatories elevation and compression which she has been wearing.  He did return to the ER secondary to worsening symptoms and there appeared to been slight progression based on the follow-up ultrasound, again no DVT was identified.  However given his symptoms he was started on Eliquis.  At the time that he is seeing me he has started his Eliquis and is still at the double dose.  Current Meds  Medication Sig   apixaban (ELIQUIS) 5 MG TABS tablet Take 1 tablet (5 mg total) by mouth 2 (two) times daily.   APIXABAN (ELIQUIS) VTE STARTER PACK (10MG  AND 5MG ) Take as directed on package: start with two-5mg  tablets twice daily for 7 days. On day 8, switch to one-5mg  tablet twice daily.    Past Medical History:  Diagnosis Date   Shoulder dislocation     History reviewed. No pertinent surgical history.  Social History Social History   Tobacco Use   Smoking status: Never   Smokeless tobacco: Never  Substance Use Topics   Alcohol use: Yes   Drug use: Never    Family History History reviewed. No pertinent family history.  No Known Allergies   REVIEW OF SYSTEMS (Negative unless checked)  Constitutional: [] Weight loss  [] Fever  [] Chills Cardiac: [] Chest pain   [] Chest pressure   [] Palpitations   [] Shortness of breath when laying flat   [] Shortness of breath with exertion. Vascular:  [] Pain in legs with walking   [x] Pain in legs with standing  [] History of DVT   [] Phlebitis   [] Swelling in legs   [x] Varicose veins    [] Non-healing ulcers Pulmonary:   [] Uses home oxygen   [] Productive cough   [] Hemoptysis   [] Wheeze  [] COPD   [] Asthma Neurologic:  [] Dizziness   [] Seizures   [] History of stroke   [] History of TIA  [] Aphasia   [] Vissual changes   [] Weakness or numbness in arm   [] Weakness or numbness in leg Musculoskeletal:   [] Joint swelling   [] Joint pain   [] Low back pain Hematologic:  [] Easy bruising  [] Easy bleeding   [] Hypercoagulable state   [] Anemic Gastrointestinal:  [] Diarrhea   [] Vomiting  [] Gastroesophageal reflux/heartburn   [] Difficulty swallowing. Genitourinary:  [] Chronic kidney disease   [] Difficult urination  [] Frequent urination   [] Blood in urine Skin:  [] Rashes   [] Ulcers  Psychological:  [] History of anxiety   []  History of major depression.  Physical Examination  Vitals:   03/12/23 1438  BP: 104/68  Pulse: 68  Resp: 16  Weight: 212 lb 9.6 oz (96.4 kg)   Body mass index is 31.4 kg/m. Gen: WD/WN, NAD Head: [place]/AT, No temporalis wasting.  Ear/Nose/Throat: Hearing grossly intact, nares w/o erythema or drainage, pinna without lesions Eyes: PER, EOMI, sclera nonicteric.  Neck: Supple, no gross masses.  No JVD.  Pulmonary:  Good air movement, no audible wheezing, no use of accessory muscles.  Cardiac: RRR, precordium not hyperdynamic. Vascular:  Large varicosities present bilaterally.  Veins are tender to palpation and there are  several palpable cords consistent with his superficial thrombophlebitis on the left side mild venous stasis changes to the legs bilaterally.  Trace soft pitting edema CEAP C3sEpAsPr Vessel Right Left  Radial Palpable Palpable  Gastrointestinal: soft, non-distended. No guarding/no peritoneal signs.  Musculoskeletal: M/S 5/5 throughout.  No deformity.  Neurologic: CN 2-12 intact. Pain and light touch intact in extremities.  Symmetrical.  Speech is fluent. Motor exam as listed above. Psychiatric: Judgment intact, Mood & affect appropriate for pt's clinical  situation. Dermatologic: Venous rashes no ulcers noted.  No changes consistent with cellulitis. Lymph : No lichenification or skin changes of chronic lymphedema.  CBC Lab Results  Component Value Date   WBC 7.2 03/10/2023   HGB 15.3 03/10/2023   HCT 44.6 03/10/2023   MCV 85.8 03/10/2023   PLT 260 03/10/2023    BMET    Component Value Date/Time   NA 136 03/10/2023 1244   NA 139 02/12/2012 0444   K 4.1 03/10/2023 1244   K 3.7 02/12/2012 0444   CL 102 03/10/2023 1244   CL 104 02/12/2012 0444   CO2 26 03/10/2023 1244   CO2 27 02/12/2012 0444   GLUCOSE 95 03/10/2023 1244   GLUCOSE 110 (H) 02/12/2012 0444   BUN 18 03/10/2023 1244   BUN 19 (H) 02/12/2012 0444   CREATININE 0.69 03/10/2023 1244   CREATININE 0.86 02/12/2012 0444   CALCIUM 8.8 (L) 03/10/2023 1244   CALCIUM 8.6 02/12/2012 0444   GFRNONAA >60 03/10/2023 1244   GFRNONAA >60 02/12/2012 0444   GFRAA >60 02/12/2012 0444   Estimated Creatinine Clearance: 146.3 mL/min (by C-G formula based on SCr of 0.69 mg/dL).  COAG No results found for: "INR", "PROTIME"  Radiology US Venous Img Lower Unilateral Left (DVT) Result Date: 03/10/2023 CLINICAL DATA:  Follow-up of superficial thrombophlebitis of the left ankle, distal calf and foot with continued pain and edema. EXAM: LEFT LOWER EXTREMITY VENOUS DOPPLER ULTRASOUND TECHNIQUE: Gray-scale sonography with graded compression, as well as color Doppler and duplex ultrasound were performed to evaluate the lower extremity deep venous systems from the level of the common femoral vein and including the common femoral, femoral, profunda femoral, popliteal and calf veins including the posterior tibial, peroneal and gastrocnemius veins when visible. The superficial great saphenous vein was also interrogated. Spectral Doppler was utilized to evaluate flow at rest and with distal augmentation maneuvers in the common femoral, femoral and popliteal veins. COMPARISON:  02/03/2023 FINDINGS:  Contralateral Common Femoral Vein: Respiratory phasicity is normal and symmetric with the symptomatic side. No evidence of thrombus. Normal compressibility. Common Femoral Vein: No evidence of thrombus. Normal compressibility, respiratory phasicity and response to augmentation. Saphenofemoral Junction: No evidence of thrombus. Normal compressibility and flow on color Doppler imaging. Profunda Femoral Vein: No evidence of thrombus. Normal compressibility and flow on color Doppler imaging. Femoral Vein: No evidence of thrombus. Normal compressibility, respiratory phasicity and response to augmentation. Popliteal Vein: No evidence of thrombus. Normal compressibility, respiratory phasicity and response to augmentation. Calf Veins: No evidence of thrombus. Normal compressibility and flow on color Doppler imaging. Superficial Great Saphenous Vein: No evidence of thrombus. Normal compressibility. Venous Reflux:  None. Other Findings: Persistent superficial thrombophlebitis of a superficial vein/varicosity along the medial calf extending to the ankle and medial aspect of the foot. Based on visualized distribution by ultrasound today the thrombus is more echogenic consistent with chronicity and may be slightly more extensive. No propagation is identified into the deep venous system. IMPRESSION: 1. No evidence of left lower extremity  deep venous thrombosis. 2. Persistent superficial thrombophlebitis of the medial calf extending to the ankle and medial aspect of the foot. Based on visualized distribution by ultrasound today the thrombus is more echogenic consistent with chronicity and may be slightly more extensive. No propagation is identified into the deep venous system. Electronically Signed   By: Irish Lack M.D.   On: 03/10/2023 14:51     Assessment/Plan 1. Thrombophlebitis of superficial veins of left lower extremity (Primary) Patient will continue his Eliquis.  We discussed that we will go for 6 weeks.  He  will follow-up with me in the office at that time with a repeat ultrasound that includes a reflux study to better characterize his varicose veins.  He will continue compression he will continue elevation and he will utilize ice for the tender areas.  2. Varicose veins of both lower extremities with inflammation  Recommend:  The patient has large symptomatic varicose veins that are painful and associated with swelling. The patient is CEAP C4sEpAsPr   I have had a long discussion with the patient regarding  varicose veins and why they cause symptoms.  Patient will begin wearing graduated compression stockings class 1 on a daily basis, beginning first thing in the morning and removing them in the evening. The patient is instructed specifically not to sleep in the stockings.    In addition, behavioral modification including elevation during the day will be initiated.    Pending the results of these changes the  patient will be reevaluated in three months.   An ultrasound of the venous system will be obtained.   Further plans will be based on the ultrasound results and whether conservative therapies are successful at eliminating the pain and swelling.  - VAS [place] LOWER EXTREMITY VENOUS REFLUX; Future    Levora Dredge, MD  03/14/2023 2:15 PM

## 2023-03-26 ENCOUNTER — Encounter (INDEPENDENT_AMBULATORY_CARE_PROVIDER_SITE_OTHER): Payer: Self-pay | Admitting: Vascular Surgery

## 2023-03-26 ENCOUNTER — Encounter (INDEPENDENT_AMBULATORY_CARE_PROVIDER_SITE_OTHER): Payer: Self-pay

## 2023-03-26 ENCOUNTER — Encounter (INDEPENDENT_AMBULATORY_CARE_PROVIDER_SITE_OTHER): Payer: Self-pay | Admitting: Nurse Practitioner

## 2023-04-22 NOTE — Progress Notes (Unsigned)
 MRN : [id number]  Jonathan Roth is a 38 y.o. (10-17-1985) male who presents with chief complaint of legs hurt and swell.  History of Present Illness:   The patient presents to the office for follow-up evaluation of superficial thrombophlebitis.  The superficial thrombophlebitis was identified at [facility] by Duplex ultrasound.  The initial symptoms were pain and swelling in the lower extremity.  The patient notes the left leg pain is essentially resolved. Symptoms are much better with elevation.  The patient notes minimal edema in the morning which steadily worsens throughout the day.    The patient has been using compression therapy at this point.  He notes great success with his compression and in fact has identified that when he does not wear compression he has significant edema just above his normal sock.  The patient has been on Eliquis since the last visit.  Duplex ultrasound of the venous system left lower extremity demonstrates reflux within the common femoral vein.  There is no reflux identified in the remainder of the deep veins.  There is reflux noted in the great saphenous vein.  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  No outpatient medications have been marked as taking for the 04/23/23 encounter (Appointment) with Gilda Crease, Latina Craver, MD.    Past Medical History:  Diagnosis Date   Shoulder dislocation     No past surgical history on file.  Social History Social History   Tobacco Use   Smoking status: Never   Smokeless tobacco: Never  Substance Use Topics   Alcohol use: Yes   Drug use: Never    Family History No family history on file.  No Known Allergies   REVIEW OF SYSTEMS (Negative unless checked)  Constitutional: [] Weight loss  [] Fever  [] Chills Cardiac: [] Chest pain   [] Chest pressure   [] Palpitations   [] Shortness of breath when laying flat   [] Shortness of breath with exertion. Vascular:  [] Pain in legs with walking    [x] Pain in legs at rest  [] History of DVT   [] Phlebitis   [x] Swelling in legs   [] Varicose veins   [] Non-healing ulcers Pulmonary:   [] Uses home oxygen   [] Productive cough   [] Hemoptysis   [] Wheeze  [] COPD   [] Asthma Neurologic:  [] Dizziness   [] Seizures   [] History of stroke   [] History of TIA  [] Aphasia   [] Vissual changes   [] Weakness or numbness in arm   [] Weakness or numbness in leg Musculoskeletal:   [] Joint swelling   [] Joint pain   [] Low back pain Hematologic:  [] Easy bruising  [] Easy bleeding   [] Hypercoagulable state   [] Anemic Gastrointestinal:  [] Diarrhea   [] Vomiting  [] Gastroesophageal reflux/heartburn   [] Difficulty swallowing. Genitourinary:  [] Chronic kidney disease   [] Difficult urination  [] Frequent urination   [] Blood in urine Skin:  [] Rashes   [] Ulcers  Psychological:  [] History of anxiety   []  History of major depression.  Physical Examination  There were no vitals filed for this visit. There is no height or weight on file to calculate BMI. Gen: WD/WN, NAD Head: [place]/AT, No temporalis wasting.  Ear/Nose/Throat: Hearing grossly intact, nares w/o erythema or drainage, pinna without lesions Eyes: PER, EOMI, sclera nonicteric.  Neck: Supple, no gross masses.  No JVD.  Pulmonary:  Good air movement, no audible wheezing, no use of accessory muscles.  Cardiac: RRR, precordium not hyperdynamic. Vascular:  scattered small varicosities present bilaterally.  Mild venous stasis changes to the  legs bilaterally.  1+ soft pitting edema. CEAP C4sEpAsPr   Vessel Right Left  Radial Palpable Palpable  Gastrointestinal: soft, non-distended. No guarding/no peritoneal signs.  Musculoskeletal: M/S 5/5 throughout.  No deformity.  Neurologic: CN 2-12 intact. Pain and light touch intact in extremities.  Symmetrical.  Speech is fluent. Motor exam as listed above. Psychiatric: Judgment intact, Mood & affect appropriate for pt's clinical situation. Dermatologic: Venous rashes no ulcers noted.   No changes consistent with cellulitis. Lymph : No lichenification or skin changes of chronic lymphedema.  CBC Lab Results  Component Value Date   WBC 7.2 03/10/2023   HGB 15.3 03/10/2023   HCT 44.6 03/10/2023   MCV 85.8 03/10/2023   PLT 260 03/10/2023    BMET    Component Value Date/Time   NA 136 03/10/2023 1244   NA 139 02/12/2012 0444   K 4.1 03/10/2023 1244   K 3.7 02/12/2012 0444   CL 102 03/10/2023 1244   CL 104 02/12/2012 0444   CO2 26 03/10/2023 1244   CO2 27 02/12/2012 0444   GLUCOSE 95 03/10/2023 1244   GLUCOSE 110 (H) 02/12/2012 0444   BUN 18 03/10/2023 1244   BUN 19 (H) 02/12/2012 0444   CREATININE 0.69 03/10/2023 1244   CREATININE 0.86 02/12/2012 0444   CALCIUM 8.8 (L) 03/10/2023 1244   CALCIUM 8.6 02/12/2012 0444   GFRNONAA >60 03/10/2023 1244   GFRNONAA >60 02/12/2012 0444   GFRAA >60 02/12/2012 0444   CrCl cannot be calculated (Patient's most recent lab result is older than the maximum 21 days allowed.).  COAG No results found for: "INR", "PROTIME"  Radiology No results found.   Assessment/Plan 1. Thrombophlebitis of superficial veins of left lower extremity (Primary) Patient is doing well and is superficial phlebitis has essentially resolved.  He has scattered varicose veins.  He has noticed significant benefits from wearing compression and therefore we will continue to do this.  We did discuss the possibility of treating this saphenous vein as the source of his varicose veins but they really not all that prominent at this point and therefore we will not pursue this.  He will follow-up with me on an as-needed basis.  He will discontinue his Eliquis.  2. Varicose veins of both lower extremities with inflammation Patient is doing well and is superficial phlebitis has essentially resolved.  He has scattered varicose veins.  He has noticed significant benefits from wearing compression and therefore we will continue to do this.  We did discuss the  possibility of treating this saphenous vein as the source of his varicose veins but they really not all that prominent at this point and therefore we will not pursue this.  He will follow-up with me on an as-needed basis.  He will discontinue his Eliquis.    Levora Dredge, MD  04/22/2023 8:12 PM

## 2023-04-23 ENCOUNTER — Ambulatory Visit (INDEPENDENT_AMBULATORY_CARE_PROVIDER_SITE_OTHER): Payer: BC Managed Care – PPO

## 2023-04-23 ENCOUNTER — Encounter (INDEPENDENT_AMBULATORY_CARE_PROVIDER_SITE_OTHER): Payer: Self-pay | Admitting: Vascular Surgery

## 2023-04-23 ENCOUNTER — Ambulatory Visit (INDEPENDENT_AMBULATORY_CARE_PROVIDER_SITE_OTHER): Payer: BC Managed Care – PPO | Admitting: Vascular Surgery

## 2023-04-23 VITALS — BP 117/72 | HR 65 | Resp 16 | Wt 219.4 lb

## 2023-04-23 DIAGNOSIS — I8002 Phlebitis and thrombophlebitis of superficial vessels of left lower extremity: Secondary | ICD-10-CM

## 2023-04-23 DIAGNOSIS — I8312 Varicose veins of left lower extremity with inflammation: Secondary | ICD-10-CM | POA: Diagnosis not present

## 2023-04-23 DIAGNOSIS — I8311 Varicose veins of right lower extremity with inflammation: Secondary | ICD-10-CM

## 2023-05-06 ENCOUNTER — Ambulatory Visit: Payer: BC Managed Care – PPO | Admitting: Family Medicine

## 2023-06-16 ENCOUNTER — Encounter (INDEPENDENT_AMBULATORY_CARE_PROVIDER_SITE_OTHER): Payer: Self-pay
